# Patient Record
Sex: Male | Born: 1995 | Race: Black or African American | Hispanic: No | Marital: Single | Smoking: Current every day smoker
Health system: Southern US, Community
[De-identification: ages and names within clinical notes are randomized; demographics above are authoritative.]

## PROBLEM LIST (undated history)

## (undated) HISTORY — PX: LYMPH NODE DISSECTION: SHX5087

---

## 2015-06-23 ENCOUNTER — Encounter (HOSPITAL_COMMUNITY): Payer: Self-pay | Admitting: Emergency Medicine

## 2015-06-23 ENCOUNTER — Emergency Department (HOSPITAL_COMMUNITY): Payer: Self-pay

## 2015-06-23 DIAGNOSIS — R109 Unspecified abdominal pain: Secondary | ICD-10-CM | POA: Insufficient documentation

## 2015-06-23 DIAGNOSIS — R079 Chest pain, unspecified: Secondary | ICD-10-CM | POA: Insufficient documentation

## 2015-06-23 DIAGNOSIS — R1900 Intra-abdominal and pelvic swelling, mass and lump, unspecified site: Secondary | ICD-10-CM | POA: Insufficient documentation

## 2015-06-23 DIAGNOSIS — Z720 Tobacco use: Secondary | ICD-10-CM | POA: Insufficient documentation

## 2015-06-23 DIAGNOSIS — R42 Dizziness and giddiness: Secondary | ICD-10-CM | POA: Insufficient documentation

## 2015-06-23 LAB — CBC WITH DIFFERENTIAL/PLATELET
BASOS ABS: 0.1 10*3/uL (ref 0.0–0.1)
BASOS PCT: 1 % (ref 0–1)
EOS ABS: 0.5 10*3/uL (ref 0.0–0.7)
Eosinophils Relative: 5 % (ref 0–5)
HCT: 39.5 % (ref 39.0–52.0)
HEMOGLOBIN: 14 g/dL (ref 13.0–17.0)
Lymphocytes Relative: 34 % (ref 12–46)
Lymphs Abs: 3.3 10*3/uL (ref 0.7–4.0)
MCH: 31.5 pg (ref 26.0–34.0)
MCHC: 35.4 g/dL (ref 30.0–36.0)
MCV: 88.8 fL (ref 78.0–100.0)
Monocytes Absolute: 0.4 10*3/uL (ref 0.1–1.0)
Monocytes Relative: 4 % (ref 3–12)
NEUTROS ABS: 5.4 10*3/uL (ref 1.7–7.7)
Neutrophils Relative %: 56 % (ref 43–77)
PLATELETS: 282 10*3/uL (ref 150–400)
RBC: 4.45 MIL/uL (ref 4.22–5.81)
RDW: 13.2 % (ref 11.5–15.5)
WBC: 9.7 10*3/uL (ref 4.0–10.5)

## 2015-06-23 LAB — I-STAT TROPONIN, ED: TROPONIN I, POC: 0 ng/mL (ref 0.00–0.08)

## 2015-06-23 LAB — I-STAT CHEM 8, ED
BUN: 5 mg/dL — ABNORMAL LOW (ref 6–20)
CHLORIDE: 101 mmol/L (ref 101–111)
CREATININE: 0.8 mg/dL (ref 0.61–1.24)
Calcium, Ion: 1.27 mmol/L — ABNORMAL HIGH (ref 1.12–1.23)
Glucose, Bld: 94 mg/dL (ref 65–99)
HEMATOCRIT: 45 % (ref 39.0–52.0)
HEMOGLOBIN: 15.3 g/dL (ref 13.0–17.0)
Potassium: 4 mmol/L (ref 3.5–5.1)
Sodium: 141 mmol/L (ref 135–145)
TCO2: 24 mmol/L (ref 0–100)

## 2015-06-23 NOTE — ED Notes (Signed)
Pt st's he has a lump on right abd x's 2 weeks. St's he has pain in the area.  Also c/o chest pain off and on for approx 1 yr.  St's he can feel his heart fluttering,.

## 2015-06-24 ENCOUNTER — Emergency Department (HOSPITAL_COMMUNITY)
Admission: EM | Admit: 2015-06-24 | Discharge: 2015-06-24 | Disposition: A | Discharge status: Home / Self Care | Payer: Self-pay | Attending: Emergency Medicine | Admitting: Emergency Medicine

## 2015-06-24 DIAGNOSIS — R079 Chest pain, unspecified: Secondary | ICD-10-CM

## 2015-06-24 DIAGNOSIS — R109 Unspecified abdominal pain: Secondary | ICD-10-CM

## 2015-06-24 MED ORDER — SUCRALFATE 1 GM/10ML PO SUSP: 1.0000 g | Freq: Three times a day (TID) | ORAL | Status: AC

## 2015-06-24 MED ORDER — OMEPRAZOLE 20 MG PO CPDR: 20.0000 mg | DELAYED_RELEASE_CAPSULE | Freq: Every day | ORAL | Status: AC

## 2015-06-24 NOTE — ED Provider Notes (Signed)
CSN: [id number]     Arrival date & time 06/23/15  1924 History   First MD Initiated Contact with Patient 06/24/15 0019     Chief Complaint  Patient presents with  . Abdominal Pain    (Consider location/radiation/quality/duration/timing/severity/associated sxs/prior Treatment) HPI Comments: 19 year old male with no significant past medical history presents to the emergency department for multiple complaints. Patient initially complaining of an area to his right lower abdomen which he believes has been swollen 2 weeks. Patient states that this area is causing him some discomfort at times, especially when rubbing over top of the area that is swollen. Patient states that the swelling has been constant without worsening in the discomfort associated with it is intermittent. He describes the pain as a pinching pain. No medications taken prior to arrival. No associated fever, nausea, vomiting, diarrhea, urinary symptoms, penile discharge, testicular tenderness, or color change to the skin around the area of swelling. Patient denies a history of abdominal surgeries.  Patient with secondary complaint of a sharp, stabbing chest pain which is located in his central chest. He reports that the pain is intermittent in and without modifying factors. No medications taken for symptoms prior to arrival. He reports that he believes he may have esophageal reflux. He does not know whether this may be contributing to his chest pain today. He reports a feeling of lightheadedness at times when the pain is present, but this is not present every time he experiences chest pain. Patient reports that his dad died of a heart attack in his 30s. His paternal aunts also have a history of MI and heart failure. Patient has no known history of hypertension, hyperlipidemia, diabetes, DVT, or PE. No hx of sudden cardiac death in teens/20's.  The history is provided by the patient. No language interpreter was used.    History reviewed. No  pertinent past medical history. Past Surgical History  Procedure Laterality Date  . Lymph node dissection     History reviewed. No pertinent family history. History  Substance Use Topics  . Smoking status: Current Every Day Smoker  . Smokeless tobacco: Not on file  . Alcohol Use: Yes    Review of Systems  Cardiovascular: Positive for chest pain.  Gastrointestinal: Positive for abdominal pain.  All other systems reviewed and are negative.   Allergies  Review of patient's allergies indicates no known allergies.  Home Medications   Prior to Admission medications   Medication Sig Start Date End Date Taking? Authorizing Provider  ibuprofen (ADVIL,MOTRIN) 200 MG tablet Take 600 mg by mouth every 6 (six) hours as needed for mild pain.   Yes Historical Provider, MD  omeprazole (PRILOSEC) 20 MG capsule Take 1 capsule (20 mg total) by mouth daily. 06/24/15   Antony MaduraKelly Naelani Lafrance, PA-C  sucralfate (CARAFATE) 1 GM/10ML suspension Take 10 mLs (1 g total) by mouth 4 (four) times daily -  with meals and at bedtime. 06/24/15   Antony MaduraKelly Byanca Kasper, PA-C   BP 137/83 mmHg  Pulse 53  Temp(Src) 98.4 F (36.9 C) (Oral)  Resp 14  Ht 5\' 10"  (1.778 m)  Wt 140 lb 5 oz (63.645 kg)  BMI 20.13 kg/m2  SpO2 100%   Physical Exam  Constitutional: He is oriented to person, place, and time. He appears well-developed and well-nourished. No distress.  Nontoxic/nonseptic appearing  HENT:  Head: Normocephalic and atraumatic.  Eyes: Conjunctivae and EOM are normal. No scleral icterus.  Neck: Normal range of motion.  Cardiovascular: Regular rhythm and intact distal pulses.  Bradycardic rate  Pulmonary/Chest: Effort normal and breath sounds normal. No respiratory distress. He has no wheezes. He has no rales.  Respirations even and unlabored  Abdominal: Soft. He exhibits no distension. There is tenderness. There is no rebound and no guarding.  <1cm subcutaneous papule noted without erythema, heat to touch, purulence,  induration, or erythematous streaking. Mild TTP when manipulating papule. No other abdominal TTP. No significant masses. No peritoneal signs. Abdomen soft.  Musculoskeletal: Normal range of motion.  Neurological: He is alert and oriented to person, place, and time. He exhibits normal muscle tone. Coordination normal.  GCS 15. Patient ambulatory with steady gait.  Skin: Skin is warm and dry. No rash noted. He is not diaphoretic. No erythema. No pallor.  Psychiatric: He has a normal mood and affect. His behavior is normal.  Nursing note and vitals reviewed.   ED Course  Procedures (including critical care time) Labs Review Labs Reviewed  I-STAT CHEM 8, ED - Abnormal; Notable for the following:    BUN 5 (*)    Calcium, Ion 1.27 (*)    All other components within normal limits  CBC WITH DIFFERENTIAL/PLATELET  Rosezena Sensor, ED    Imaging Review Dg Chest 2 View  06/23/2015   CLINICAL DATA:  19 year old, smoker for 1 year, presenting with acute onset chest pain, shortness of breath and upper and lower extremity numbness.  EXAM: CHEST  2 VIEW  COMPARISON:  None.  FINDINGS: Cardiomediastinal silhouette unremarkable. Lungs clear. Bronchovascular markings normal. Pulmonary vascularity normal. No pneumothorax. No pleural effusions. Visualized bony thorax intact.  IMPRESSION: Normal examination.   Electronically Signed   By: Hulan Saas M.D.   On: 06/23/2015 20:41    ED ECG REPORT   Date: 06/24/2015  Rate: 64  Rhythm: normal sinus rhythm  QRS Axis: normal  Intervals: normal  ST/T Wave abnormalities: normal  Conduction Disutrbances:none  Narrative Interpretation: NSR with sinus arrhythmia; LVH. No STEMI  Old EKG Reviewed: none available  I have personally reviewed and interpreted this EKG   MDM   Final diagnoses:  Chest pain, unspecified chest pain type  Abdominal wall pain    19 year old male presents to the emergency department for further evaluation of abdominal pain.  Abdominal pain appears associated with a subcutaneous papule which is palpated. No other masses identified and abdomen is soft. Laboratory workup is noncontributory. Patient is afebrile as well as hemodynamically stable. Doubt emergent abdominal etiology as cause of symptoms today.  Patient also with complaints of chest pain which has been intermittent over the past year. He has a negative cardiac workup with a nonischemic EKG. Chest x-ray shows no evidence of acute cardiopulmonary process. Given age, lack of risk factors, and chronicity of symptoms with a heart score of 0, do not believe further emergent workup is indicated. Patient has been advised to follow-up with his primary care doctor for recheck of both his abdominal pain and his chest pain complaints. Patient will be started on Prilosec and Carafate as he endorses a history of reflux which may be contributing to his chest pain today. Return precautions discussed and provided. Patient agreeable to plan with no unaddressed concerns. Patient discharged in good condition.   Filed Vitals:   06/24/15 0023 06/24/15 0030 06/24/15 0041 06/24/15 0044  BP: 151/84 137/83  137/83  Pulse: 48 54  53  Temp:    98.4 F (36.9 C)  TempSrc:    Oral  Resp: 13   14  Height:  Weight:      SpO2: 100% 100% 100% 100%     Antony Madura, PA-C 06/24/15 1610  Derwood Kaplan, MD 06/25/15 [phone]

## 2015-06-24 NOTE — Discharge Instructions (Signed)
Recommend that you follow-up with your primary care doctor for further evaluation of your chest pain and abdominal pain. Your workup today was very reassuring. If you continue to have chest pain despite treatment with Prilosec and Carafate, you may benefit from an outpatient stress test which may be scheduled by a primary care doctor. Recommend applying warm compresses to your abdomen to try and alleviate symptoms. You may take Tylenol or ibuprofen as needed for pain. Return to the emergency department as needed if symptoms worsen.  Chest Pain (Nonspecific) It is often hard to give a specific diagnosis for the cause of chest pain. There is always a chance that your pain could be related to something serious, such as a heart attack or a blood clot in the lungs. You need to follow up with your health care provider for further evaluation. CAUSES   Heartburn.  Pneumonia or bronchitis.  Anxiety or stress.  Inflammation around your heart (pericarditis) or lung (pleuritis or pleurisy).  A blood clot in the lung.  A collapsed lung (pneumothorax). It can develop suddenly on its own (spontaneous pneumothorax) or from trauma to the chest.  Shingles infection (herpes zoster virus). The chest wall is composed of bones, muscles, and cartilage. Any of these can be the source of the pain.  The bones can be bruised by injury.  The muscles or cartilage can be strained by coughing or overwork.  The cartilage can be affected by inflammation and become sore (costochondritis). DIAGNOSIS  Lab tests or other studies may be needed to find the cause of your pain. Your health care provider may have you take a test called an ambulatory electrocardiogram (ECG). An ECG records your heartbeat patterns over a 24-hour period. You may also have other tests, such as:  Transthoracic echocardiogram (TTE). During echocardiography, sound waves are used to evaluate how blood flows through your heart.  Transesophageal  echocardiogram (TEE).  Cardiac monitoring. This allows your health care provider to monitor your heart rate and rhythm in real time.  Holter monitor. This is a portable device that records your heartbeat and can help diagnose heart arrhythmias. It allows your health care provider to track your heart activity for several days, if needed.  Stress tests by exercise or by giving medicine that makes the heart beat faster. TREATMENT   Treatment depends on what may be causing your chest pain. Treatment may include:  Acid blockers for heartburn.  Anti-inflammatory medicine.  Pain medicine for inflammatory conditions.  Antibiotics if an infection is present.  You may be advised to change lifestyle habits. This includes stopping smoking and avoiding alcohol, caffeine, and chocolate.  You may be advised to keep your head raised (elevated) when sleeping. This reduces the chance of acid going backward from your stomach into your esophagus. Most of the time, nonspecific chest pain will improve within 2-3 days with rest and mild pain medicine.  HOME CARE INSTRUCTIONS   If antibiotics were prescribed, take them as directed. Finish them even if you start to feel better.  For the next few days, avoid physical activities that bring on chest pain. Continue physical activities as directed.  Do not use any tobacco products, including cigarettes, chewing tobacco, or electronic cigarettes.  Avoid drinking alcohol.  Only take medicine as directed by your health care provider.  Follow your health care provider's suggestions for further testing if your chest pain does not go away.  Keep any follow-up appointments you made. If you do not go to an appointment, you  could develop lasting (chronic) problems with pain. If there is any problem keeping an appointment, call to reschedule. SEEK MEDICAL CARE IF:   Your chest pain does not go away, even after treatment.  You have a rash with blisters on your  chest.  You have a fever. SEEK IMMEDIATE MEDICAL CARE IF:   You have increased chest pain or pain that spreads to your arm, neck, jaw, back, or abdomen.  You have shortness of breath.  You have an increasing cough, or you cough up blood.  You have severe back or abdominal pain.  You feel nauseous or vomit.  You have severe weakness.  You faint.  You have chills. This is an emergency. Do not wait to see if the pain will go away. Get medical help at once. Call your local emergency services (911 in [place] Do not drive yourself to the hospital. MAKE SURE YOU:   Understand these instructions.  Will watch your condition.  Will get help right away if you are not doing well or get worse. Document Released: 09/12/2005 Document Revised: 12/08/2013 Document Reviewed: 07/08/2008 [facility] Patient Information 2015 [place], [place]. This information is not intended to replace advice given to you by your health care provider. Make sure you discuss any questions you have with your health care provider.

## 2016-12-22 IMAGING — DX DG CHEST 2V
2 series · 2 of 2 positions shown · non-contrast
Comparison: None.

CLINICAL DATA: 18-year-old, smoker for 1 year, presenting with
acute onset chest pain, shortness of breath and upper and lower
extremity numbness.

EXAM:
CHEST  2 VIEW

[chest pa]
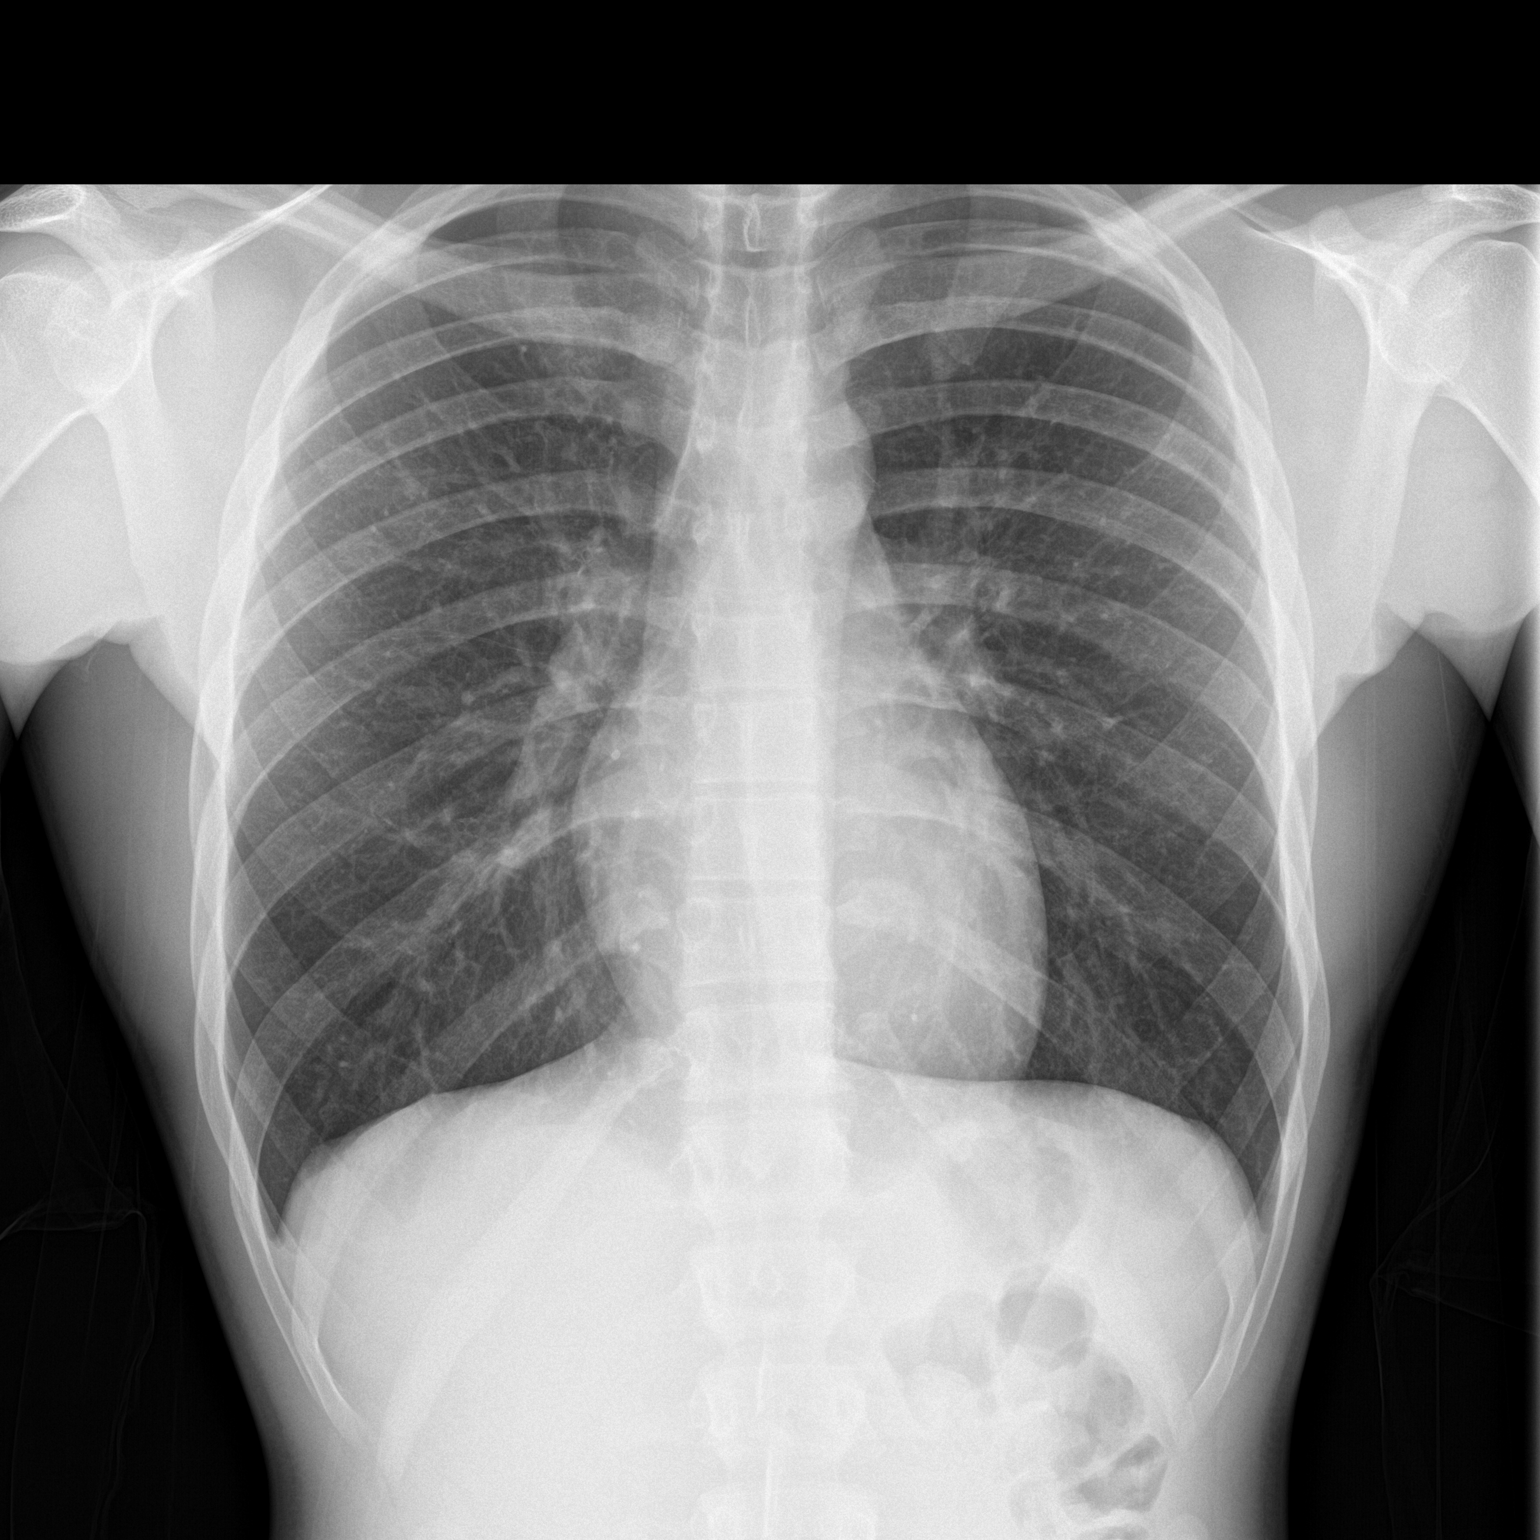

[chest lat]
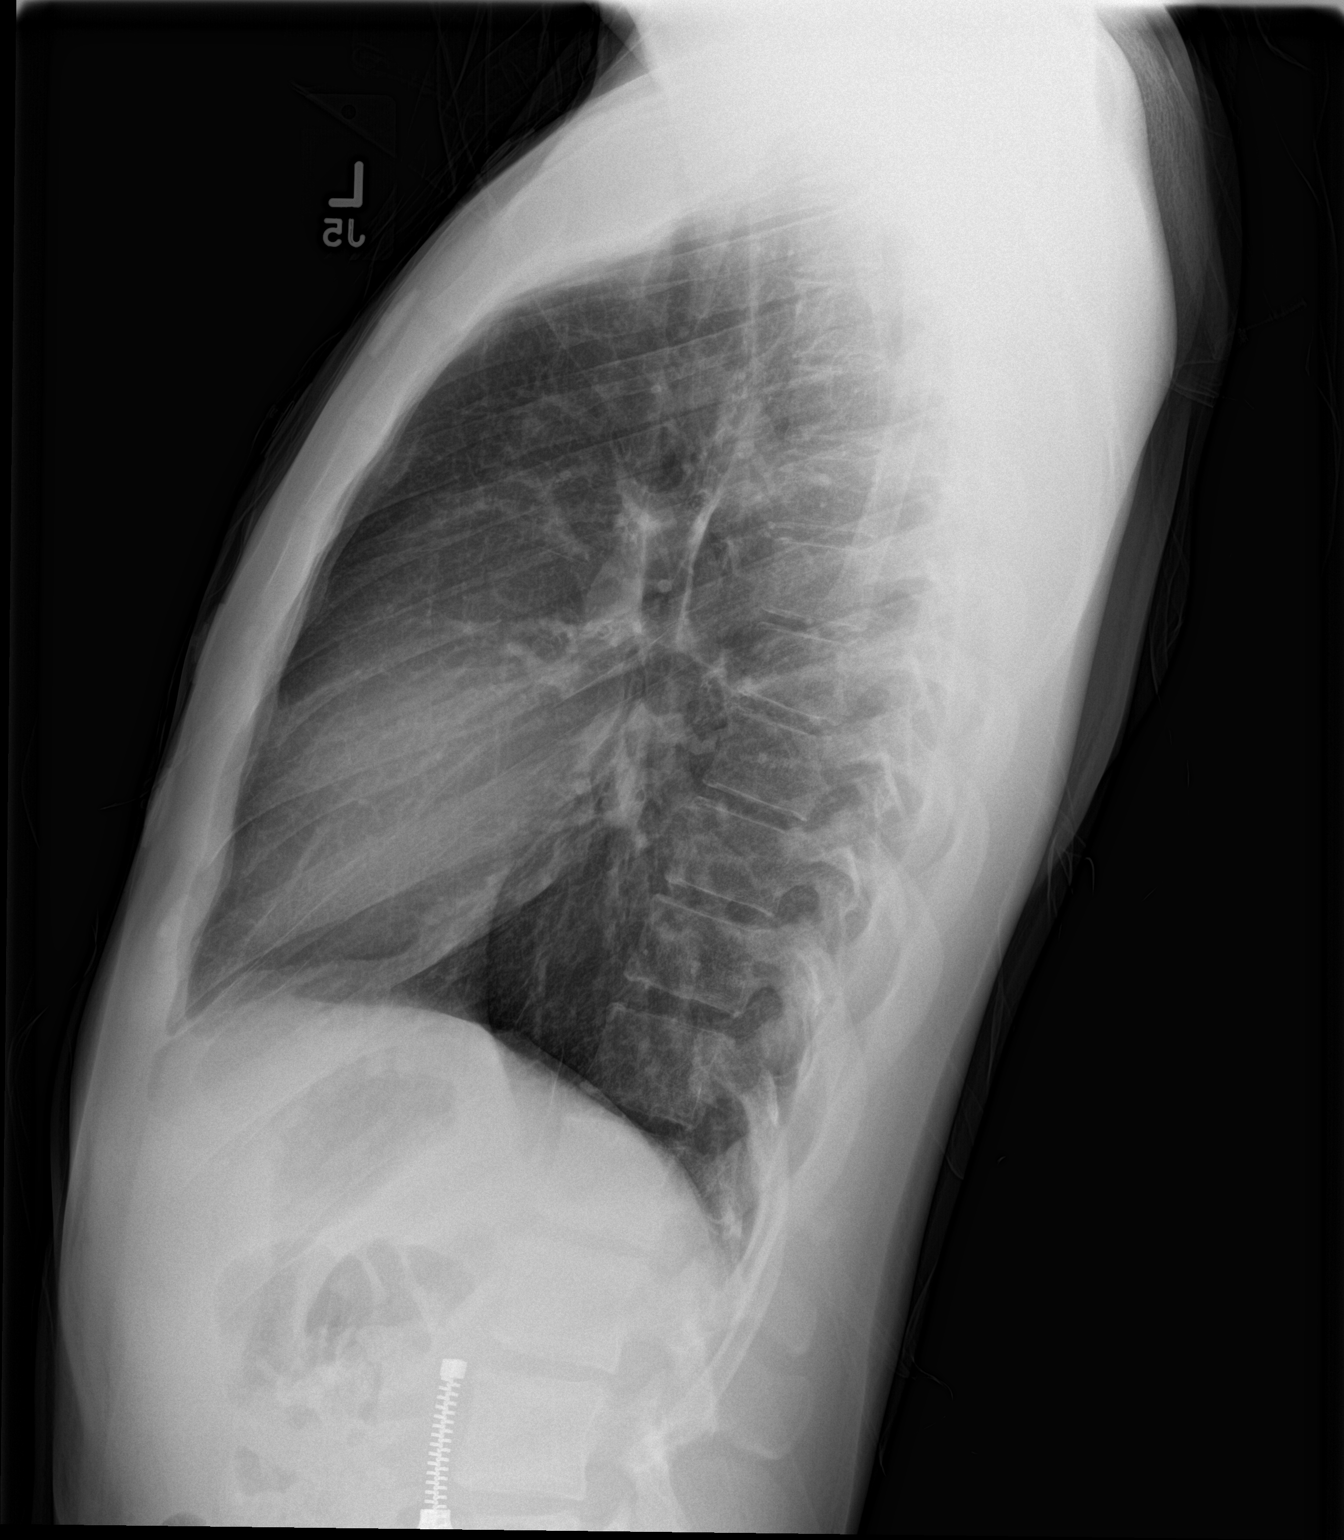

[2 of 2 positions shown; findings below may reference images not displayed]

FINDINGS: Cardiomediastinal silhouette unremarkable. Lungs clear.
Bronchovascular markings normal. Pulmonary vascularity normal. No
pneumothorax. No pleural effusions. Visualized bony thorax intact.
IMPRESSION: Normal examination.
